# Patient Record
Sex: Female | Born: 1990 | Race: White | Hispanic: No | Marital: Single | Smoking: Current every day smoker
Health system: Southern US, Community
[De-identification: ages and names within clinical notes are randomized; demographics above are authoritative.]

## PROBLEM LIST (undated history)

## (undated) ENCOUNTER — Inpatient Hospital Stay (HOSPITAL_COMMUNITY): Payer: Self-pay

## (undated) DIAGNOSIS — B999 Unspecified infectious disease: Secondary | ICD-10-CM

## (undated) DIAGNOSIS — N83209 Unspecified ovarian cyst, unspecified side: Secondary | ICD-10-CM

## (undated) DIAGNOSIS — Z5189 Encounter for other specified aftercare: Secondary | ICD-10-CM

## (undated) DIAGNOSIS — R87629 Unspecified abnormal cytological findings in specimens from vagina: Secondary | ICD-10-CM

## (undated) DIAGNOSIS — N200 Calculus of kidney: Secondary | ICD-10-CM

## (undated) DIAGNOSIS — R002 Palpitations: Secondary | ICD-10-CM

## (undated) DIAGNOSIS — F419 Anxiety disorder, unspecified: Secondary | ICD-10-CM

## (undated) DIAGNOSIS — A749 Chlamydial infection, unspecified: Secondary | ICD-10-CM

## (undated) DIAGNOSIS — A549 Gonococcal infection, unspecified: Secondary | ICD-10-CM

## (undated) HISTORY — PX: DILATION AND CURETTAGE OF UTERUS: SHX78

---

## 2015-08-13 ENCOUNTER — Inpatient Hospital Stay (HOSPITAL_COMMUNITY)
Admission: AD | Admit: 2015-08-13 | Discharge: 2015-08-13 | Disposition: A | Discharge status: Home / Self Care | Payer: Medicaid Other | Source: Ambulatory Visit | Attending: Obstetrics and Gynecology | Admitting: Obstetrics and Gynecology

## 2015-08-13 ENCOUNTER — Inpatient Hospital Stay (HOSPITAL_COMMUNITY): Payer: Medicaid Other

## 2015-08-13 ENCOUNTER — Encounter (HOSPITAL_COMMUNITY): Payer: Self-pay | Admitting: *Deleted

## 2015-08-13 DIAGNOSIS — Z87442 Personal history of urinary calculi: Secondary | ICD-10-CM | POA: Insufficient documentation

## 2015-08-13 DIAGNOSIS — R109 Unspecified abdominal pain: Secondary | ICD-10-CM

## 2015-08-13 DIAGNOSIS — Z3A01 Less than 8 weeks gestation of pregnancy: Secondary | ICD-10-CM | POA: Diagnosis not present

## 2015-08-13 DIAGNOSIS — O9989 Other specified diseases and conditions complicating pregnancy, childbirth and the puerperium: Secondary | ICD-10-CM

## 2015-08-13 DIAGNOSIS — O99331 Smoking (tobacco) complicating pregnancy, first trimester: Secondary | ICD-10-CM | POA: Insufficient documentation

## 2015-08-13 DIAGNOSIS — Z3491 Encounter for supervision of normal pregnancy, unspecified, first trimester: Secondary | ICD-10-CM

## 2015-08-13 DIAGNOSIS — O26899 Other specified pregnancy related conditions, unspecified trimester: Secondary | ICD-10-CM

## 2015-08-13 DIAGNOSIS — O23591 Infection of other part of genital tract in pregnancy, first trimester: Secondary | ICD-10-CM | POA: Insufficient documentation

## 2015-08-13 DIAGNOSIS — F17210 Nicotine dependence, cigarettes, uncomplicated: Secondary | ICD-10-CM | POA: Insufficient documentation

## 2015-08-13 DIAGNOSIS — N760 Acute vaginitis: Secondary | ICD-10-CM | POA: Insufficient documentation

## 2015-08-13 DIAGNOSIS — R1030 Lower abdominal pain, unspecified: Secondary | ICD-10-CM | POA: Diagnosis not present

## 2015-08-13 DIAGNOSIS — B9689 Other specified bacterial agents as the cause of diseases classified elsewhere: Secondary | ICD-10-CM

## 2015-08-13 DIAGNOSIS — O26891 Other specified pregnancy related conditions, first trimester: Secondary | ICD-10-CM | POA: Diagnosis present

## 2015-08-13 HISTORY — DX: Chlamydial infection, unspecified: A74.9

## 2015-08-13 HISTORY — DX: Encounter for other specified aftercare: Z51.89

## 2015-08-13 HISTORY — DX: Unspecified abnormal cytological findings in specimens from vagina: R87.629

## 2015-08-13 HISTORY — DX: Unspecified ovarian cyst, unspecified side: N83.209

## 2015-08-13 HISTORY — DX: Calculus of kidney: N20.0

## 2015-08-13 HISTORY — DX: Palpitations: R00.2

## 2015-08-13 HISTORY — DX: Gonococcal infection, unspecified: A54.9

## 2015-08-13 HISTORY — DX: Unspecified infectious disease: B99.9

## 2015-08-13 HISTORY — DX: Anxiety disorder, unspecified: F41.9

## 2015-08-13 LAB — URINALYSIS, ROUTINE W REFLEX MICROSCOPIC
Bilirubin Urine: NEGATIVE
Glucose, UA: NEGATIVE mg/dL
Hgb urine dipstick: NEGATIVE
KETONES UR: NEGATIVE mg/dL
LEUKOCYTES UA: NEGATIVE
NITRITE: NEGATIVE
PROTEIN: NEGATIVE mg/dL
Specific Gravity, Urine: >1.03 — ABNORMAL HIGH (ref 1.005–1.030)
pH: 5.5 (ref 5.0–8.0)

## 2015-08-13 LAB — HCG, QUANTITATIVE, PREGNANCY: hCG, Beta Chain, Quant, S: 130131 m[IU]/mL — ABNORMAL HIGH (ref <5)

## 2015-08-13 LAB — ABO/RH: ABO/RH(D): O POS

## 2015-08-13 LAB — WET PREP, GENITAL
SPERM: NONE SEEN
Trich, Wet Prep: NONE SEEN
Yeast Wet Prep HPF POC: NONE SEEN

## 2015-08-13 LAB — CBC
HCT: 35.1 % — ABNORMAL LOW (ref 36.0–46.0)
Hemoglobin: 12.2 g/dL (ref 12.0–15.0)
MCH: 30.1 pg (ref 26.0–34.0)
MCHC: 34.8 g/dL (ref 30.0–36.0)
MCV: 86.7 fL (ref 78.0–100.0)
PLATELETS: 176 10*3/uL (ref 150–400)
RBC: 4.05 MIL/uL (ref 3.87–5.11)
RDW: 13 % (ref 11.5–15.5)
WBC: 10.6 10*3/uL — ABNORMAL HIGH (ref 4.0–10.5)

## 2015-08-13 LAB — POCT PREGNANCY, URINE: PREG TEST UR: POSITIVE — AB

## 2015-08-13 MED ORDER — METRONIDAZOLE 500 MG PO TABS: 500.0000 mg | ORAL_TABLET | Freq: Two times a day (BID) | ORAL | Status: AC

## 2015-08-13 MED ORDER — PROMETHAZINE HCL 25 MG PO TABS: 25.0000 mg | ORAL_TABLET | Freq: Four times a day (QID) | ORAL | Status: AC | PRN

## 2015-08-13 NOTE — MAU Provider Note (Signed)
History     CSN: [id number]  Arrival date and time: 08/13/15 1511   First Provider Initiated Contact with Patient 08/13/15 1632         Chief Complaint  Patient presents with  . Abdominal Pain  . Back Pain   HPI  Angela Sanders is a 24 y.o. [id number] at [redacted]w[redacted]d who presents for abdominal pain.  Lower abdominal pain since last week. Feels more suprapubic like previous UTIs. Denies dysuria. Reports increased urinary frequency & foul odor of urine. Denies hematuria, flank pain, or fever.  Denies vaginal bleeding.  Reports increase in white vaginal discharge.  Reports hx of SAB in August that required [facility] & blood transfusion.     OB History    Gravida Para Term Preterm AB TAB SAB Ectopic Multiple Living   Past Medical History  Diagnosis Date  . Blood transfusion without reported diagnosis   . Anxiety   . Heart palpitations   . Infection     UTI  . Kidney stone   . Vaginal Pap smear, abnormal   . Ovarian cyst   . Chlamydia     age 75  . Gonorrhea     age 48    Past Surgical History  Procedure Laterality Date  . Dilation and curettage of uterus    . Cesarean section      Family History  Problem Relation Age of Onset  . Asthma Mother   . Heart disease Paternal Grandmother     Social History  Substance Use Topics  . Smoking status: Current Every Day Smoker -- 0.25 packs/day for 9 years    Types: Cigarettes  . Smokeless tobacco: Never Used  . Alcohol Use: No    Allergies: No Known Allergies  Prescriptions prior to admission  Medication Sig Dispense Refill Last Dose  . Aspirin-Acetaminophen-Caffeine (GOODY HEADACHE PO) Take 1 packet by mouth daily as needed (for headache).   Past Week at Unknown time  . Prenatal Vit-Min-FA-Fish Oil ([employer] PRENATAL GUMMY PO) Take 2 each by mouth daily.   08/13/2015 at Unknown time    ROS Physical Exam   Blood pressure 111/67, pulse 82, temperature 98.4 F (36.9 C), temperature source Oral, resp. rate  18, height  (1.651 m), weight 184 lb (83.462 kg), last menstrual period 06/13/2015.  Physical Exam  Nursing note and vitals reviewed. Constitutional: She is oriented to person, place, and time. She appears well-developed and well-nourished. No distress.  HENT:  Head: Normocephalic and atraumatic.  Eyes: Conjunctivae are normal. Right eye exhibits no discharge. Left eye exhibits no discharge. No scleral icterus.  Neck: Normal range of motion.  Cardiovascular: Normal rate, regular rhythm and normal heart sounds.   No murmur heard. Respiratory: Effort normal and breath sounds normal. No respiratory distress. She has no wheezes.  GI: Soft. Bowel sounds are normal. She exhibits no distension and no mass. There is tenderness (lower abdomen, worse suprapubically). There is no rebound and no guarding.  Genitourinary: Vagina normal. Uterus is enlarged. Uterus is not tender. Cervix exhibits discharge (moderate amount of thin white discharge). Cervix exhibits no motion tenderness and no friability.  Cervix closed  Neurological: She is alert and oriented to person, place, and time.  Skin: Skin is warm and dry. She is not diaphoretic.  Psychiatric: She has a normal mood and affect. Her behavior is normal. Judgment and thought content normal.    MAU Course  Procedures Results for orders placed or performed during the hospital encounter of 08/13/15 (from the past 24 hour(s))  Urinalysis, Routine w reflex microscopic (not [facility])     Status: Abnormal   Collection Time: 08/13/15  3:45 PM  Result Value Ref Range   Color, Urine YELLOW YELLOW   APPearance CLEAR CLEAR   Specific Gravity, Urine >1.030 (H) 1.005 - 1.030   pH 5.5 5.0 - 8.0   Glucose, UA NEGATIVE NEGATIVE mg/dL   Hgb urine dipstick NEGATIVE NEGATIVE   Bilirubin Urine NEGATIVE NEGATIVE   Ketones, ur NEGATIVE NEGATIVE mg/dL   Protein, ur NEGATIVE NEGATIVE mg/dL   Nitrite NEGATIVE NEGATIVE   Leukocytes, UA NEGATIVE NEGATIVE   Pregnancy, urine POC     Status: Abnormal   Collection Time: 08/13/15  4:11 PM  Result Value Ref Range   Preg Test, Ur POSITIVE (A) NEGATIVE  CBC     Status: Abnormal   Collection Time: 08/13/15  4:30 PM  Result Value Ref Range   WBC 10.6 (H) 4.0 - 10.5 K/uL   RBC 4.05 3.87 - 5.11 MIL/uL   Hemoglobin 12.2 12.0 - 15.0 g/dL   HCT 16.135.1 (L) 09.636.0 - 04.546.0 %   MCV 86.7 78.0 - 100.0 fL   MCH 30.1 26.0 - 34.0 pg   MCHC 34.8 30.0 - 36.0 g/dL   RDW 40.913.0 81.111.5 - 91.415.5 %   Platelets 176 150 - 400 K/uL  ABO/Rh     Status: None (Preliminary result)   Collection Time: 08/13/15  4:30 PM  Result Value Ref Range   ABO/RH(D) O POS   hCG, quantitative, pregnancy     Status: Abnormal   Collection Time: 08/13/15  4:30 PM  Result Value Ref Range   hCG, Beta Chain, Quant, S 130131 (H) <5 mIU/mL  Wet prep, genital     Status: Abnormal   Collection Time: 08/13/15  4:42 PM  Result Value Ref Range   Yeast Wet Prep HPF POC NONE SEEN NONE SEEN   Trich, Wet Prep NONE SEEN NONE SEEN   Clue Cells Wet Prep HPF POC PRESENT (A) NONE SEEN   WBC, Wet Prep HPF POC MANY (A) NONE SEEN   Sperm NONE SEEN    [place] Ob Comp Less 14 Wks  08/13/2015  CLINICAL DATA:  Pelvic pain EXAM: OBSTETRIC <14 WK ULTRASOUND TECHNIQUE: Transabdominal ultrasound was performed for evaluation of the gestation as well as the maternal uterus and adnexal regions. COMPARISON:  None. FINDINGS: Intrauterine gestational sac: Visualized/normal in shape. Yolk sac:  Present Embryo:  Present Cardiac Activity: Present Heart Rate: 150 bpm CRL:   9.7  mm   7 w 0d                  US EDC: 03/31/2016 Maternal uterus/adnexae: No adnexal mass. Normal bilateral ovaries. No subchorionic hemorrhage. No pelvic free fluid. IMPRESSION: Single live intrauterine pregnancy as detailed above. Electronically Signed   By: Elige KoHetal  Patel   On: 08/13/2015 17:21    MDM O positive Ultrasound shows 7 wk IUP Assessment and Plan  A: 1. Abdominal pain in pregnancy   2. Normal IUP  (intrauterine pregnancy) on prenatal ultrasound, first trimester   3. BV (bacterial vaginosis)    P: Discharge home Rx flagyl & phenergan Pregnancy verification letter & list of providers given Start prenatal care & continue taking prenatal vitamins Discussed reasons to return to MAU  Judeth HornErin Robynn Marcel, NP  08/13/2015, 4:31 PM

## 2015-08-13 NOTE — Discharge Instructions (Signed)
Bacterial Vaginosis °Bacterial vaginosis is a vaginal infection that occurs when the normal balance of bacteria in the vagina is disrupted. It results from an overgrowth of certain bacteria. This is the most common vaginal infection in women of childbearing age. Treatment is important to prevent complications, especially in pregnant women, as it can cause a premature delivery. °CAUSES  °Bacterial vaginosis is caused by an increase in harmful bacteria that are normally present in smaller amounts in the vagina. Several different kinds of bacteria can cause bacterial vaginosis. However, the reason that the condition develops is not fully understood. °RISK FACTORS °Certain activities or behaviors can put you at an increased risk of developing bacterial vaginosis, including: °· Having a new sex partner or multiple sex partners. °· Douching. °· Using an intrauterine device (IUD) for contraception. °Women do not get bacterial vaginosis from toilet seats, bedding, swimming pools, or contact with objects around them. °SIGNS AND SYMPTOMS  °Some women with bacterial vaginosis have no signs or symptoms. Common symptoms include: °· Grey vaginal discharge. °· A fishlike odor with discharge, especially after sexual intercourse. °· Itching or burning of the vagina and vulva. °· Burning or pain with urination. °DIAGNOSIS  °Your health care provider will take a medical history and examine the vagina for signs of bacterial vaginosis. A sample of vaginal fluid may be taken. Your health care provider will look at this sample under a microscope to check for bacteria and abnormal cells. A vaginal pH test may also be done.  °TREATMENT  °Bacterial vaginosis may be treated with antibiotic medicines. These may be given in the form of a pill or a vaginal cream. A second round of antibiotics may be prescribed if the condition comes back after treatment. Because bacterial vaginosis increases your risk for sexually transmitted diseases, getting  treated can help reduce your risk for chlamydia, gonorrhea, HIV, and herpes. °HOME CARE INSTRUCTIONS  °· Only take over-the-counter or prescription medicines as directed by your health care provider. °· If antibiotic medicine was prescribed, take it as directed. Make sure you finish it even if you start to feel better. °· Tell all sexual partners that you have a vaginal infection. They should see their health care provider and be treated if they have problems, such as a mild rash or itching. °· During treatment, it is important that you follow these instructions: °· Avoid sexual activity or use condoms correctly. °· Do not douche. °· Avoid alcohol as directed by your health care provider. °· Avoid breastfeeding as directed by your health care provider. °SEEK MEDICAL CARE IF:  °· Your symptoms are not improving after 3 days of treatment. °· You have increased discharge or pain. °· You have a fever. °MAKE SURE YOU:  °· Understand these instructions. °· Will watch your condition. °· Will get help right away if you are not doing well or get worse. °FOR MORE INFORMATION  °Centers for Disease Control and Prevention, Division of STD Prevention: www.cdc.gov/std °American Sexual Health Association (ASHA): www.ashastd.org  °  °This information is not intended to replace advice given to you by your health care provider. Make sure you discuss any questions you have with your health care provider. °  °Document Released: 09/10/2005 Document Revised: 10/01/2014 Document Reviewed: 04/22/2013 °Elsevier Interactive Patient Education ©2016 [employer] °First Trimester of Pregnancy °The first trimester of pregnancy is from week 1 until the end of week 12 (months 1 through 3). A week after a sperm fertilizes an egg, the egg will implant on the   wall of the uterus. This embryo will begin to develop into a baby. Genes from you and your partner are forming the baby. The female genes determine whether the baby is a boy or a girl. At 6-8  weeks, the eyes and face are formed, and the heartbeat can be seen on ultrasound. At the end of 12 weeks, all the baby's organs are formed.  °Now that you are pregnant, you will want to do everything you can to have a healthy baby. Two of the most important things are to get good prenatal care and to follow your health care provider's instructions. Prenatal care is all the medical care you receive before the baby's birth. This care will help prevent, find, and treat any problems during the pregnancy and childbirth. °BODY CHANGES °Your body goes through many changes during pregnancy. The changes vary from woman to woman.  °· You may gain or lose a couple of pounds at first. °· You may feel sick to your stomach (nauseous) and throw up (vomit). If the vomiting is uncontrollable, call your health care provider. °· You may tire easily. °· You may develop headaches that can be relieved by medicines approved by your health care provider. °· You may urinate more often. Painful urination may mean you have a bladder infection. °· You may develop heartburn as a result of your pregnancy. °· You may develop constipation because certain hormones are causing the muscles that push waste through your intestines to slow down. °· You may develop hemorrhoids or swollen, bulging veins (varicose veins). °· Your breasts may begin to grow larger and become tender. Your nipples may stick out more, and the tissue that surrounds them (areola) may become darker. °· Your gums may bleed and may be sensitive to brushing and flossing. °· Dark spots or blotches (chloasma, mask of pregnancy) may develop on your face. This will likely fade after the baby is born. °· Your menstrual periods will stop. °· You may have a loss of appetite. °· You may develop cravings for certain kinds of food. °· You may have changes in your emotions from day to day, such as being excited to be pregnant or being concerned that something may go wrong with the pregnancy and  baby. °· You may have more vivid and strange dreams. °· You may have changes in your hair. These can include thickening of your hair, rapid growth, and changes in texture. Some women also have hair loss during or after pregnancy, or hair that feels dry or thin. Your hair will most likely return to normal after your baby is born. °WHAT TO EXPECT AT YOUR PRENATAL VISITS °During a routine prenatal visit: °· You will be weighed to make sure you and the baby are growing normally. °· Your blood pressure will be taken. °· Your abdomen will be measured to track your baby's growth. °· The fetal heartbeat will be listened to starting around week 10 or 12 of your pregnancy. °· Test results from any previous visits will be discussed. °Your health care provider may ask you: °· How you are feeling. °· If you are feeling the baby move. °· If you have had any abnormal symptoms, such as leaking fluid, bleeding, severe headaches, or abdominal cramping. °· If you are using any tobacco products, including cigarettes, chewing tobacco, and electronic cigarettes. °· If you have any questions. °Other tests that may be performed during your first trimester include: °· Blood tests to find your blood type and to check for   the presence of any previous infections. They will also be used to check for low iron levels (anemia) and Rh antibodies. Later in the pregnancy, blood tests for diabetes will be done along with other tests if problems develop. °· Urine tests to check for infections, diabetes, or protein in the urine. °· An ultrasound to confirm the proper growth and development of the baby. °· An amniocentesis to check for possible genetic problems. °· Fetal screens for spina bifida and Down syndrome. °· You may need other tests to make sure you and the baby are doing well. °· HIV (human immunodeficiency virus) testing. Routine prenatal testing includes screening for HIV, unless you choose not to have this test. °HOME CARE INSTRUCTIONS    °Medicines °· Follow your health care provider's instructions regarding medicine use. Specific medicines may be either safe or unsafe to take during pregnancy. °· Take your prenatal vitamins as directed. °· If you develop constipation, try taking a stool softener if your health care provider approves. °Diet °· Eat regular, well-balanced meals. Choose a variety of foods, such as meat or vegetable-based protein, fish, milk and low-fat dairy products, vegetables, fruits, and whole grain breads and cereals. Your health care provider will help you determine the amount of weight gain that is right for you. °· Avoid raw meat and uncooked cheese. These carry germs that can cause birth defects in the baby. °· Eating four or five small meals rather than three large meals a day may help relieve nausea and vomiting. If you start to feel nauseous, eating a few soda crackers can be helpful. Drinking liquids between meals instead of during meals also seems to help nausea and vomiting. °· If you develop constipation, eat more high-fiber foods, such as fresh vegetables or fruit and whole grains. Drink enough fluids to keep your urine clear or pale yellow. °Activity and Exercise °· Exercise only as directed by your health care provider. Exercising will help you: °¨ Control your weight. °¨ Stay in shape. °¨ Be prepared for labor and delivery. °· Experiencing pain or cramping in the lower abdomen or low back is a good sign that you should stop exercising. Check with your health care provider before continuing normal exercises. °· Try to avoid standing for long periods of time. Move your legs often if you must stand in one place for a long time. °· Avoid heavy lifting. °· Wear low-heeled shoes, and practice good posture. °· You may continue to have sex unless your health care provider directs you otherwise. °Relief of Pain or Discomfort °· Wear a good support bra for breast tenderness.   °· Take warm sitz baths to soothe any pain or  discomfort caused by hemorrhoids. Use hemorrhoid cream if your health care provider approves.   °· Rest with your legs elevated if you have leg cramps or low back pain. °· If you develop varicose veins in your legs, wear support hose. Elevate your feet for 15 minutes, 3-4 times a day. Limit salt in your diet. °Prenatal Care °· Schedule your prenatal visits by the twelfth week of pregnancy. They are usually scheduled monthly at first, then more often in the last 2 months before delivery. °· Write down your questions. Take them to your prenatal visits. °· Keep all your prenatal visits as directed by your health care provider. °Safety °· Wear your seat belt at all times when driving. °· Make a list of emergency phone numbers, including numbers for family, friends, the hospital, and police and fire departments. °General   Tips  Ask your health care provider for a referral to a local prenatal education class. Begin classes no later than at the beginning of month 6 of your pregnancy.  Ask for help if you have counseling or nutritional needs during pregnancy. Your health care provider can offer advice or refer you to specialists for help with various needs.  Do not use hot tubs, steam rooms, or saunas.  Do not douche or use tampons or scented sanitary pads.  Do not cross your legs for long periods of time.  Avoid cat litter boxes and soil used by cats. These carry germs that can cause birth defects in the baby and possibly loss of the fetus by miscarriage or stillbirth.  Avoid all smoking, herbs, alcohol, and medicines not prescribed by your health care provider. Chemicals in these affect the formation and growth of the baby.  Do not use any tobacco products, including cigarettes, chewing tobacco, and electronic cigarettes. If you need help quitting, ask your health care provider. You may receive counseling support and other resources to help you quit.  Schedule a dentist appointment. At home, brush your  teeth with a soft toothbrush and be gentle when you floss. SEEK MEDICAL CARE IF:   You have dizziness.  You have mild pelvic cramps, pelvic pressure, or nagging pain in the abdominal area.  You have persistent nausea, vomiting, or diarrhea.  You have a bad smelling vaginal discharge.  You have pain with urination.  You notice increased swelling in your face, hands, legs, or ankles. SEEK IMMEDIATE MEDICAL CARE IF:   You have a fever.  You are leaking fluid from your vagina.  You have spotting or bleeding from your vagina.  You have severe abdominal cramping or pain.  You have rapid weight gain or loss.  You vomit blood or material that looks like coffee grounds.  You are exposed to [place] measles and have never had them.  You are exposed to fifth disease or chickenpox.  You develop a severe headache.  You have shortness of breath.  You have any kind of trauma, such as from a fall or a car accident.   This information is not intended to replace advice given to you by your health care provider. Make sure you discuss any questions you have with your health care provider.   Document Released: 09/04/2001 Document Revised: 10/01/2014 Document Reviewed: 07/21/2013 Elsevier Interactive Patient Education 2016 [employer].     Safe Medications in Pregnancy   Acne: Benzoyl Peroxide Salicylic Acid  Backache/Headache: Tylenol: 2 regular strength every 4 hours OR              2 Extra strength every 6 hours  Colds/Coughs/Allergies: Benadryl (alcohol free) 25 mg every 6 hours as needed Breath right strips Claritin Cepacol throat lozenges Chloraseptic throat spray Cold-Eeze- up to three times per day Cough drops, alcohol free Flonase (by prescription only) Guaifenesin Mucinex Robitussin DM (plain only, alcohol free) Saline nasal spray/drops Sudafed (pseudoephedrine) & Actifed ** use only after [redacted] weeks gestation and if you do not have high blood  pressure Tylenol Vicks Vaporub Zinc lozenges Zyrtec   Constipation: Colace Ducolax suppositories Fleet enema Glycerin suppositories Metamucil Milk of magnesia Miralax Senokot Smooth move tea  Diarrhea: Kaopectate Imodium A-D  *NO pepto Bismol  Hemorrhoids: Anusol Anusol HC Preparation H Tucks  Indigestion: Tums Maalox Mylanta Zantac  Pepcid  Insomnia: Benadryl (alcohol free)  every 6 hours as needed Tylenol PM Unisom, no Gelcaps  Leg Cramps: Tums  MagGel  Nausea/Vomiting:  Bonine Dramamine Emetrol Ginger extract Sea bands Meclizine  Nausea medication to take during pregnancy:  Unisom (doxylamine succinate 25 mg tablets) Take one tablet daily at bedtime. If symptoms are not adequately controlled, the dose can be increased to a maximum recommended dose of two tablets daily (1/2 tablet in the morning, 1/2 tablet mid-afternoon and one at bedtime). Vitamin B6 100mg  tablets. Take one tablet twice a day (up to 200 mg per day).  Skin Rashes: Aveeno products Benadryl cream or 25mg  every 6 hours as needed Calamine Lotion 1% cortisone cream  Yeast infection: Gyne-lotrimin 7 Monistat 7   **If taking multiple medications, please check labels to avoid duplicating the same active ingredients **take medication as directed on the label ** Do not exceed 4000 mg of tylenol in 24 hours **Do not take medications that contain aspirin or ibuprofen

## 2015-08-13 NOTE — MAU Note (Signed)
Pain in lower abd (suprapubic) and lower back, pain started a couple days ago.  +HPT on 10/23.  Had a "bad"miscarriage in Aug, required emergency D&C and blood transfusion.  Urine has a different smell noted in the past wk, ? UTI

## 2015-08-14 LAB — HIV ANTIBODY (ROUTINE TESTING W REFLEX): HIV SCREEN 4TH GENERATION: NONREACTIVE

## 2015-08-16 LAB — CULTURE, OB URINE: Culture: >=100000

## 2015-08-17 ENCOUNTER — Telehealth: Payer: Self-pay | Admitting: Physician Assistant

## 2015-08-17 LAB — GC/CHLAMYDIA PROBE AMP (~~LOC~~) NOT AT ARMC
Chlamydia: NEGATIVE
NEISSERIA GONORRHEA: NEGATIVE

## 2015-08-17 MED ORDER — CEPHALEXIN 500 MG PO CAPS: 500.0000 mg | ORAL_CAPSULE | Freq: Three times a day (TID) | ORAL | Status: AC

## 2015-08-17 NOTE — Telephone Encounter (Signed)
Pt needs Keflex rx for UTI.  Pt agreeable to having this medication sent to pharmacy on file and will begin abx right away.  Also advised to begin [facility] asap.

## 2016-06-17 ENCOUNTER — Encounter (HOSPITAL_COMMUNITY): Payer: Self-pay

## 2017-07-09 IMAGING — US US OB COMP LESS 14 WK
1 series · 15 of 28 positions shown · non-contrast
Comparison: None.

CLINICAL DATA: Pelvic pain

EXAM:
OBSTETRIC <14 WK ULTRASOUND
TECHNIQUE: Transabdominal ultrasound was performed for evaluation of the
gestation as well as the maternal uterus and adnexal regions.

[Series 1: us ob comp less 14 wk · 30 acquisitions, 15 frames shown]
[im 1/30]
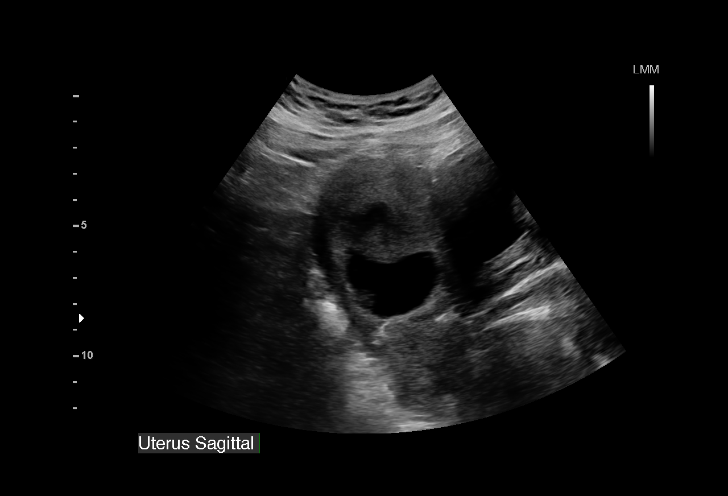
[im 3/30]
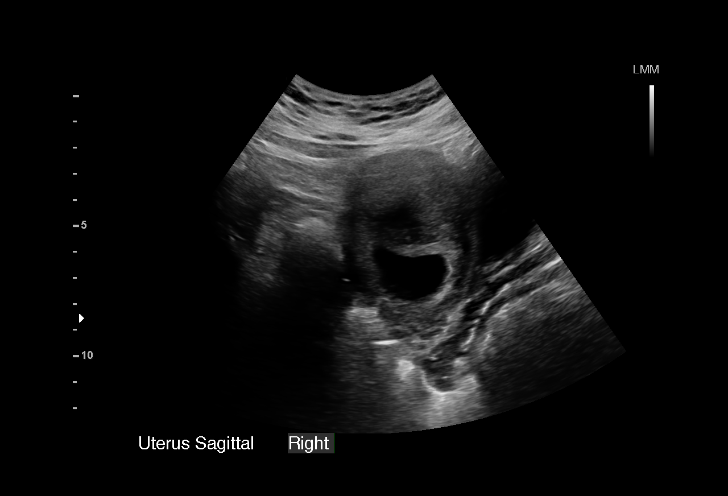
[im 5/30]
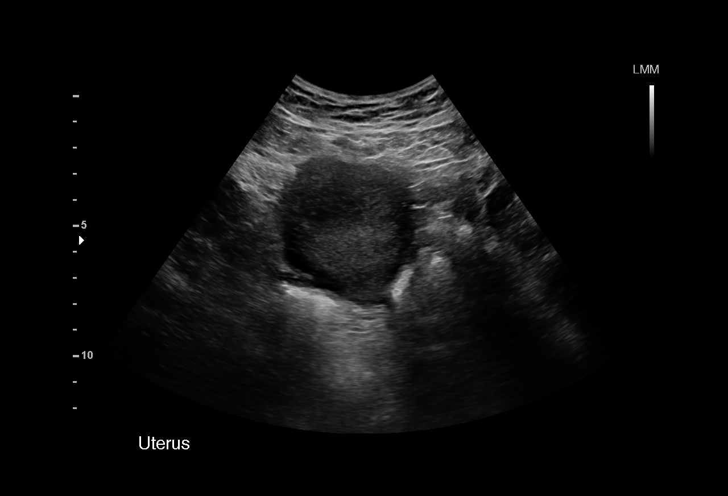
[im 7/30]
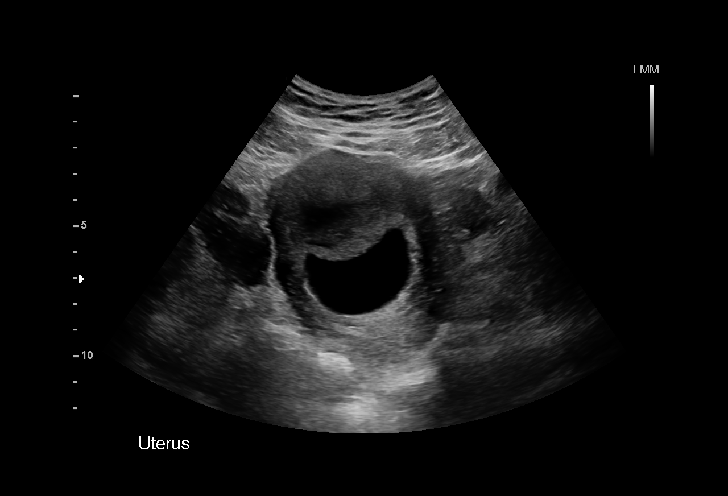
[im 9/30]
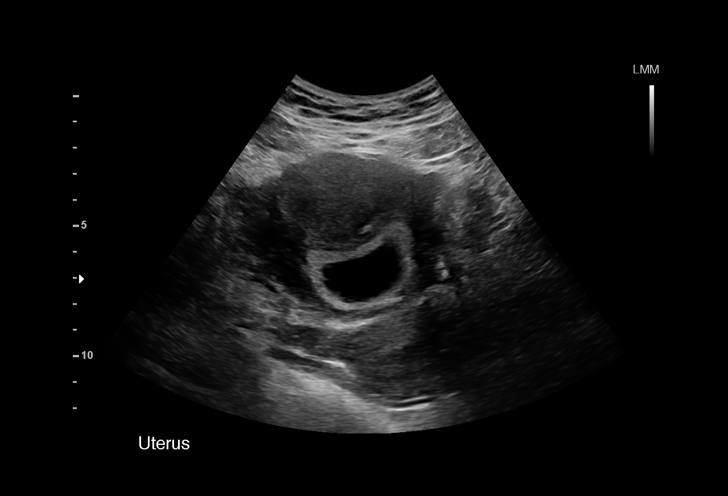
[im 11/30]
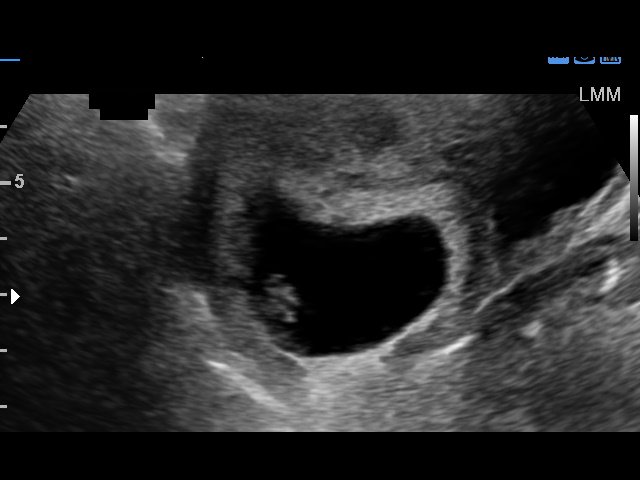
[im 13/30]
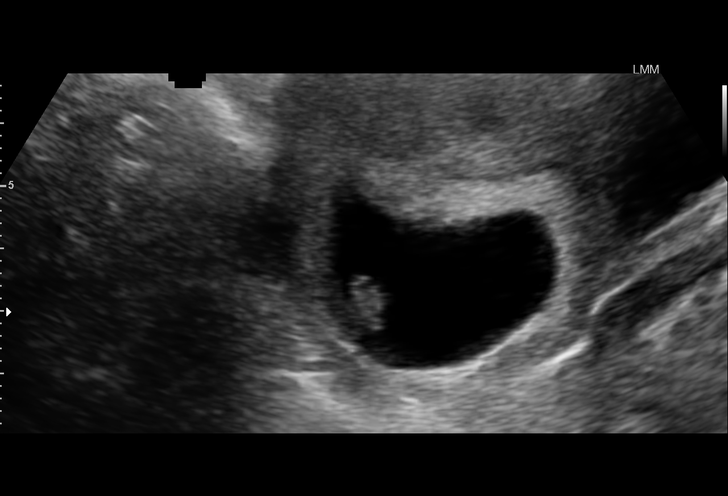
[im 16/30]
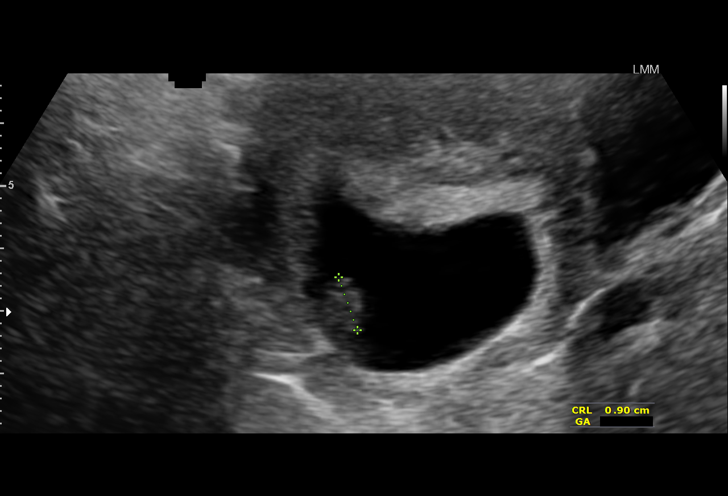
[im 17/30]
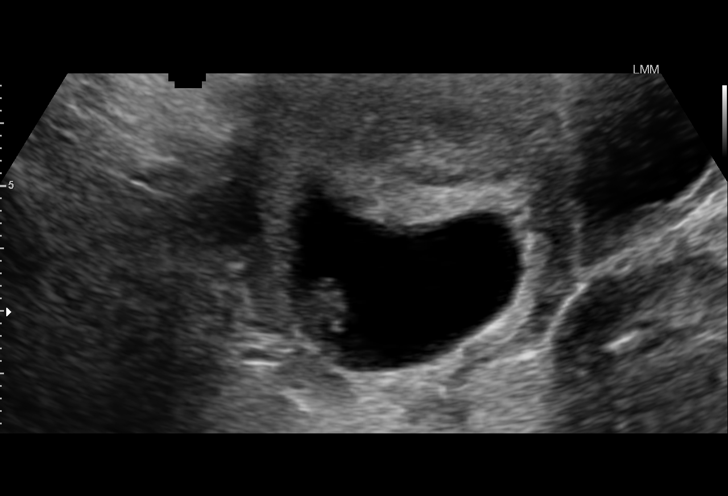
[im 19/30]
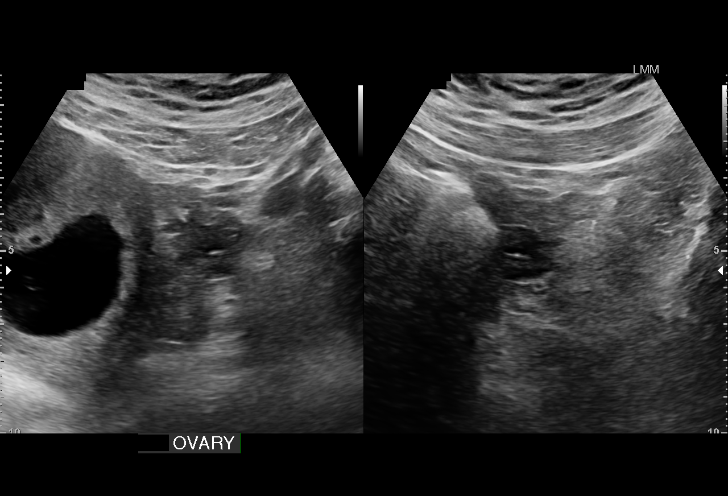
[im 21/30]
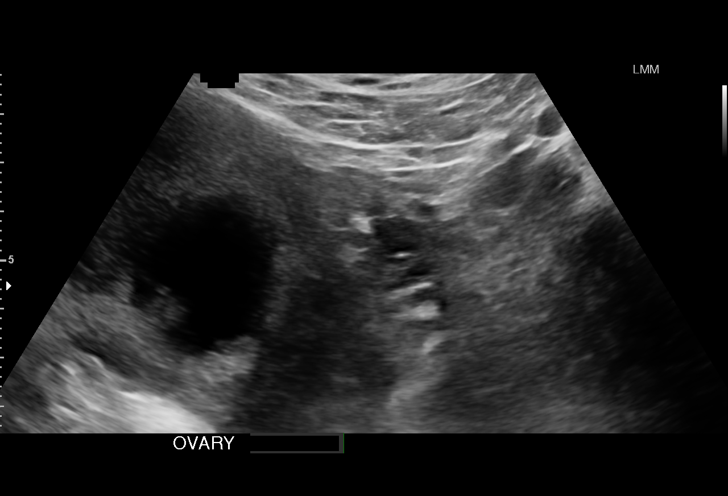
[im 23/30]
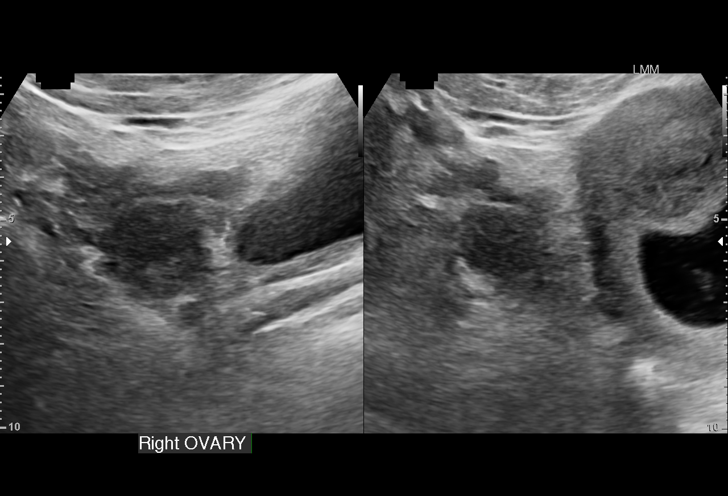
[im 25/30]
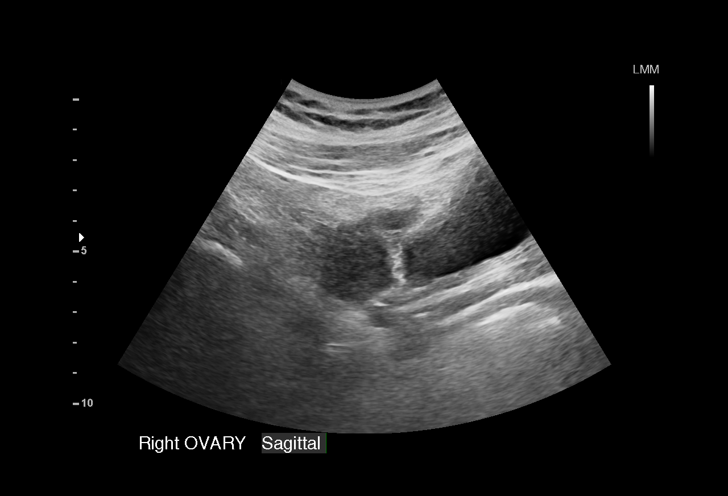
[im 27/30]
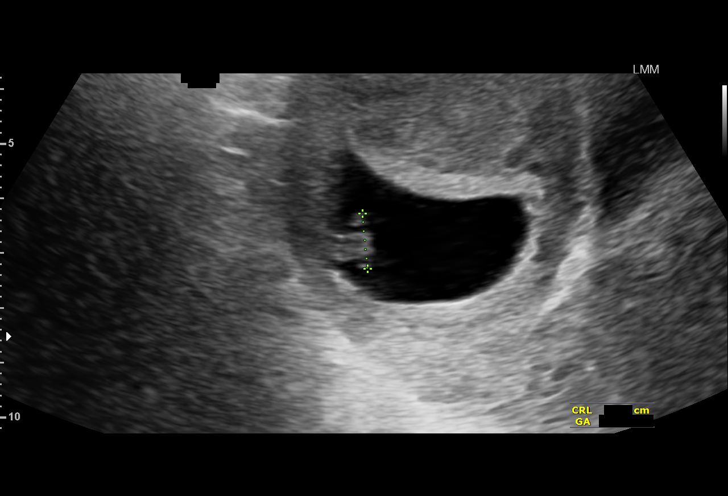
[im 30/30]
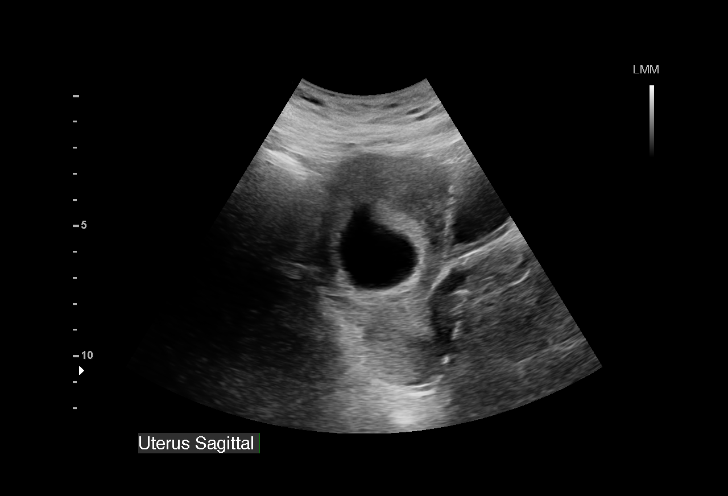

[15 of 28 positions shown; findings below may reference images not displayed]

FINDINGS: Intrauterine gestational sac: Visualized/normal in shape.

Yolk sac:  Present

Embryo:  Present

Cardiac Activity: Present

Heart Rate: 150 bpm

CRL:   9.7  mm   7 w 0d                  US EDC: 03/31/2016

Maternal uterus/adnexae: No adnexal mass. Normal bilateral ovaries.
No subchorionic hemorrhage. No pelvic free fluid.
IMPRESSION: Single live intrauterine pregnancy as detailed above.
